# Patient Record
Sex: Female | Born: 1970 | Race: White | Hispanic: No | Smoking: Current some day smoker
Health system: Southern US, Community
[De-identification: ages and names within clinical notes are randomized; demographics above are authoritative.]

## PROBLEM LIST (undated history)

## (undated) DIAGNOSIS — J449 Chronic obstructive pulmonary disease, unspecified: Secondary | ICD-10-CM

## (undated) DIAGNOSIS — F419 Anxiety disorder, unspecified: Secondary | ICD-10-CM

## (undated) DIAGNOSIS — F32A Depression, unspecified: Secondary | ICD-10-CM

## (undated) DIAGNOSIS — M1990 Unspecified osteoarthritis, unspecified site: Secondary | ICD-10-CM

## (undated) DIAGNOSIS — G5790 Unspecified mononeuropathy of unspecified lower limb: Secondary | ICD-10-CM

## (undated) DIAGNOSIS — I10 Essential (primary) hypertension: Secondary | ICD-10-CM

## (undated) HISTORY — PX: INCISE AND DRAIN ABCESS: PRO64

## (undated) HISTORY — PX: CHOLECYSTECTOMY: SHX55

## (undated) HISTORY — PX: ABDOMINAL HYSTERECTOMY: SHX81

---

## 2001-11-01 ENCOUNTER — Emergency Department (HOSPITAL_COMMUNITY): Admission: EM | Admit: 2001-11-01 | Discharge: 2001-11-01 | Payer: Self-pay | Admitting: Emergency Medicine

## 2003-03-22 ENCOUNTER — Emergency Department (HOSPITAL_COMMUNITY): Admission: EM | Admit: 2003-03-22 | Discharge: 2003-03-22 | Payer: Self-pay | Admitting: Emergency Medicine

## 2011-04-06 ENCOUNTER — Encounter: Payer: Self-pay | Admitting: *Deleted

## 2011-04-06 ENCOUNTER — Emergency Department (HOSPITAL_COMMUNITY)
Admission: EM | Admit: 2011-04-06 | Discharge: 2011-04-06 | Discharge status: Against Medical Advice | Payer: Medicaid - Out of State | Attending: Emergency Medicine | Admitting: Emergency Medicine

## 2011-04-06 DIAGNOSIS — R51 Headache: Secondary | ICD-10-CM | POA: Insufficient documentation

## 2011-04-06 DIAGNOSIS — R0602 Shortness of breath: Secondary | ICD-10-CM | POA: Insufficient documentation

## 2011-04-06 DIAGNOSIS — J4489 Other specified chronic obstructive pulmonary disease: Secondary | ICD-10-CM | POA: Insufficient documentation

## 2011-04-06 DIAGNOSIS — J449 Chronic obstructive pulmonary disease, unspecified: Secondary | ICD-10-CM | POA: Insufficient documentation

## 2011-04-06 DIAGNOSIS — J3489 Other specified disorders of nose and nasal sinuses: Secondary | ICD-10-CM | POA: Insufficient documentation

## 2011-04-06 HISTORY — DX: Chronic obstructive pulmonary disease, unspecified: J44.9

## 2011-04-06 HISTORY — DX: Unspecified osteoarthritis, unspecified site: M19.90

## 2011-04-06 HISTORY — DX: Unspecified mononeuropathy of unspecified lower limb: G57.90

## 2011-04-06 HISTORY — DX: Essential (primary) hypertension: I10

## 2011-04-06 NOTE — ED Notes (Signed)
Pt c/o sob with headache and congestion. Pt states her copd is acting up.

## 2015-10-29 ENCOUNTER — Encounter (HOSPITAL_COMMUNITY): Payer: Self-pay | Admitting: Emergency Medicine

## 2015-10-29 ENCOUNTER — Emergency Department (HOSPITAL_COMMUNITY)
Admission: EM | Admit: 2015-10-29 | Discharge: 2015-10-29 | Disposition: A | Discharge status: Home / Self Care | Payer: Medicaid Other | Attending: Emergency Medicine | Admitting: Emergency Medicine

## 2015-10-29 ENCOUNTER — Emergency Department (HOSPITAL_COMMUNITY): Payer: Medicaid Other

## 2015-10-29 DIAGNOSIS — J45909 Unspecified asthma, uncomplicated: Secondary | ICD-10-CM | POA: Diagnosis not present

## 2015-10-29 DIAGNOSIS — Y999 Unspecified external cause status: Secondary | ICD-10-CM | POA: Diagnosis not present

## 2015-10-29 DIAGNOSIS — W1839XA Other fall on same level, initial encounter: Secondary | ICD-10-CM | POA: Insufficient documentation

## 2015-10-29 DIAGNOSIS — E119 Type 2 diabetes mellitus without complications: Secondary | ICD-10-CM | POA: Diagnosis not present

## 2015-10-29 DIAGNOSIS — I10 Essential (primary) hypertension: Secondary | ICD-10-CM | POA: Insufficient documentation

## 2015-10-29 DIAGNOSIS — J449 Chronic obstructive pulmonary disease, unspecified: Secondary | ICD-10-CM | POA: Insufficient documentation

## 2015-10-29 DIAGNOSIS — Z87891 Personal history of nicotine dependence: Secondary | ICD-10-CM | POA: Insufficient documentation

## 2015-10-29 DIAGNOSIS — M1990 Unspecified osteoarthritis, unspecified site: Secondary | ICD-10-CM | POA: Diagnosis not present

## 2015-10-29 DIAGNOSIS — S5001XA Contusion of right elbow, initial encounter: Secondary | ICD-10-CM | POA: Insufficient documentation

## 2015-10-29 DIAGNOSIS — M25521 Pain in right elbow: Secondary | ICD-10-CM | POA: Diagnosis present

## 2015-10-29 DIAGNOSIS — Z79899 Other long term (current) drug therapy: Secondary | ICD-10-CM | POA: Insufficient documentation

## 2015-10-29 DIAGNOSIS — Y92009 Unspecified place in unspecified non-institutional (private) residence as the place of occurrence of the external cause: Secondary | ICD-10-CM | POA: Insufficient documentation

## 2015-10-29 DIAGNOSIS — Y939 Activity, unspecified: Secondary | ICD-10-CM | POA: Insufficient documentation

## 2015-10-29 DIAGNOSIS — Z794 Long term (current) use of insulin: Secondary | ICD-10-CM | POA: Insufficient documentation

## 2015-10-29 DIAGNOSIS — Z791 Long term (current) use of non-steroidal anti-inflammatories (NSAID): Secondary | ICD-10-CM | POA: Diagnosis not present

## 2015-10-29 LAB — CBG MONITORING, ED: GLUCOSE-CAPILLARY: 302 mg/dL — AB (ref 65–99)

## 2015-10-29 MED ORDER — HYDROCODONE-ACETAMINOPHEN 5-325 MG PO TABS: ORAL_TABLET | ORAL | Status: DC

## 2015-10-29 NOTE — ED Notes (Signed)
Pt states, "my sugar is low, i'm feeling weak and tired" and requesting juice. CBG obtained, resulted as 302. PA notified.

## 2015-10-29 NOTE — Discharge Instructions (Signed)
Elbow Contusion ° An elbow contusion is a deep bruise of the elbow. Contusions happen when an injury causes bleeding under the skin. Signs of bruising include pain, puffiness (swelling), and discolored skin. The contusion may turn blue, purple, or yellow. °HOME CARE °· Put ice on the injured area. °¨ Put ice in a plastic bag. °¨ Place a towel between your skin and the bag. °¨ Leave the ice on for 15-20 minutes, 03-04 times a day. °· Only take medicines as told by your doctor. °· Rest your elbow until the pain and puffiness are better. °· Raise (elevate) your elbow to lessen puffiness. °· Put on an elastic wrap as told by your doctor. You can take it off for sleeping, showers, and baths. If your fingers get cold, blue, or lose feeling (numb), take the wrap off. Put the wrap back on more loosely. °· Use your elbow only as told by your doctor. If you are asked to do elbow exercises, do them as told. °· Keep all doctor visits as told. °GET HELP RIGHT AWAY IF: °· You have more redness, puffiness, or pain in your elbow. °· Your puffiness or pain is not helped by medicines. °· You have puffiness of the hand and fingers. °· You are not able to move your fingers or wrist. °· You start to lose feeling in your hand or fingers. °· Your fingers or hand become cold or blue. °MAKE SURE YOU:  °· Understand these instructions. °· Will watch your condition. °· Will get help right away if you are not doing well or get worse. °  °This information is not intended to replace advice given to you by your health care provider. Make sure you discuss any questions you have with your health care provider. °  °Document Released: 03/31/2011 Document Revised: 10/11/2011 Document Reviewed: 11/24/2014 °Elsevier Interactive Patient Education ©2016 [employer] ° °

## 2015-10-29 NOTE — ED Notes (Signed)
Pt states she fell last night in her home, pt was not using walker, fell on right elbow, complaining of pain in elbow

## 2015-10-30 NOTE — ED Provider Notes (Signed)
CSN: [id number]     Arrival date & time 10/29/15  1235 History   First MD Initiated Contact with Patient 10/29/15 1313     Chief Complaint  Patient presents with  . elbow pain      (Consider location/radiation/quality/duration/timing/severity/associated sxs/prior Treatment) HPI   Veronica Garcia is a 45 y.o. female who presents to the Emergency Department complaining of Sudden onset of pain to her right elbow after a fall. Incident occurred on the evening prior to arrival. She states that she typically uses a walker, but was not using her walker at the time which she believes caused her to fall. States she landed on her elbow. Pain is worse with movement. She denies shoulder pain, neck pain, head injury, numbness or weakness of the extremity. She has not taken any medication for her symptoms.   Past Medical History  Diagnosis Date  . COPD (chronic obstructive pulmonary disease) (HCC)   . Diabetes mellitus   . Hypertension   . Arthritis   . Asthma   . Neuropathy of foot    Past Surgical History  Procedure Laterality Date  . Abdominal hysterectomy     No family history on file. Social History  Substance Use Topics  . Smoking status: Former Smoker -- 1.50 packs/day    Quit date: 10/26/2015  . Smokeless tobacco: None  . Alcohol Use: No   OB History    No data available     Review of Systems  Constitutional: Negative for fever and chills.  Respiratory: Negative for shortness of breath.   Cardiovascular: Negative for chest pain.  Gastrointestinal: Negative for nausea and vomiting.  Musculoskeletal: Positive for joint swelling and arthralgias (Right elbow pain).  Skin: Negative for color change and wound.  Neurological: Negative for dizziness, weakness, numbness and headaches.  All other systems reviewed and are negative.     Allergies  Amoxicillin and Sulfa antibiotics  Home Medications   Prior to Admission medications   Medication Sig Start Date End Date Taking?  Authorizing Provider  busPIRone (BUSPAR) 5 MG tablet Take 10 mg by mouth 2 (two) times daily.  08/27/14  Yes Historical Provider, MD  Canagliflozin (INVOKANA PO) Take 1 tablet by mouth daily.   Yes Historical Provider, MD  Fluticasone-Salmeterol (ADVAIR DISKUS IN) Inhale 2 Inhalers into the lungs 2 (two) times daily.   Yes Historical Provider, MD  gabapentin (NEURONTIN) 800 MG tablet Take 800 mg by mouth 3 (three) times daily.   Yes Historical Provider, MD  ibuprofen (ADVIL,MOTRIN) 800 MG tablet Take 800 mg by mouth 3 (three) times daily.  01/07/14  Yes Historical Provider, MD  insulin aspart (NOVOLOG) 100 UNIT/ML injection Inject 10 Units into the skin daily.  11/05/13  Yes Historical Provider, MD  insulin detemir (LEVEMIR) 100 UNIT/ML injection Inject 40 Units into the skin 2 (two) times daily.  04/29/14  Yes Historical Provider, MD  lisinopril (PRINIVIL,ZESTRIL) 10 MG tablet Take 10 mg by mouth daily.  06/25/14  Yes Historical Provider, MD  PARoxetine (PAXIL) 40 MG tablet Take 40 mg by mouth daily.  06/25/14  Yes Historical Provider, MD  pravastatin (PRAVACHOL) 20 MG tablet Take by mouth. 04/17/14  Yes Historical Provider, MD  risperiDONE (RISPERDAL) 3 MG tablet Take 3 mg by mouth at bedtime.  03/28/14  Yes Historical Provider, MD  HYDROcodone-acetaminophen (NORCO/VICODIN) 5-325 MG tablet Take one tab po q 4-6 hrs prn pain 10/29/15   Yovanny Coats, PA-C   BP 149/99 mmHg  Pulse 85  Temp(Src) 97.5  F (36.4 C) (Oral)  Resp 16  Ht 5\' 2"  (1.575 m)  Wt 97.07 kg  BMI 39.13 kg/m2  SpO2 98% Physical Exam  Constitutional: She is oriented to person, place, and time. She appears well-developed and well-nourished. No distress.  HENT:  Head: Normocephalic and atraumatic.  Cardiovascular: Normal rate, regular rhythm and normal heart sounds.   Pulmonary/Chest: Effort normal and breath sounds normal.  Musculoskeletal: She exhibits tenderness. She exhibits no edema.  Tenderness on range of motion to the lateral  right elbow.  Radial pulse is brisk, distal sensation intact.  CR< 2 sec.  No edema, bruising, abrasion or bony deformity.   Compartments soft.  Neurological: She is alert and oriented to person, place, and time. She exhibits normal muscle tone. Coordination normal.  Skin: Skin is warm and dry.  Nursing note and vitals reviewed.   ED Course  Procedures (including critical care time) Labs Review Labs Reviewed  CBG MONITORING, ED - Abnormal; Notable for the following:    Glucose-Capillary 302 (*)    All other components within normal limits    Imaging Review Dg Elbow Complete Right  10/29/2015  CLINICAL DATA:  Fall last night, right elbow pain EXAM: RIGHT ELBOW - COMPLETE 3+ VIEW COMPARISON:  None. FINDINGS: Four views of the right elbow submitted. No acute fracture or subluxation. Mild spurring of humeral epicondyles. No posterior fat pad sign. IMPRESSION: No acute fracture or subluxation.  Mild degenerative changes. Electronically Signed   By: Natasha MeadLiviu  Pop M.D.   On: 10/29/2015 13:07   I have personally reviewed and evaluated these images and lab results as part of my medical decision-making.   EKG Interpretation None      MDM   Final diagnoses:  Elbow contusion, right, initial encounter   CBG elevated, but pt admits to eating meal prior to arrival.  No clinical sx's of DKA  Patient with likely contusion of the right elbow, x-ray negative for fracture.  Sling applied by nursing staff  Patient agrees to plan which includes ice, sling, and close orthopedic follow-up in one week if not improving.     Pauline Ausammy Baylie Drakes, PA-C 10/30/15 1542  Biagio Snelson, PA-C 10/30/15 1543  Bethann BerkshireJoseph Zammit, MD 11/01/15 1739

## 2015-11-17 ENCOUNTER — Encounter (HOSPITAL_COMMUNITY): Payer: Self-pay | Admitting: Emergency Medicine

## 2015-11-17 ENCOUNTER — Emergency Department (HOSPITAL_COMMUNITY): Payer: Medicaid Other

## 2015-11-17 ENCOUNTER — Emergency Department (HOSPITAL_COMMUNITY)
Admission: EM | Admit: 2015-11-17 | Discharge: 2015-11-17 | Disposition: A | Discharge status: Home / Self Care | Payer: Medicaid Other | Attending: Emergency Medicine | Admitting: Emergency Medicine

## 2015-11-17 DIAGNOSIS — Z794 Long term (current) use of insulin: Secondary | ICD-10-CM | POA: Insufficient documentation

## 2015-11-17 DIAGNOSIS — Z79899 Other long term (current) drug therapy: Secondary | ICD-10-CM | POA: Insufficient documentation

## 2015-11-17 DIAGNOSIS — R509 Fever, unspecified: Secondary | ICD-10-CM | POA: Diagnosis present

## 2015-11-17 DIAGNOSIS — J449 Chronic obstructive pulmonary disease, unspecified: Secondary | ICD-10-CM | POA: Insufficient documentation

## 2015-11-17 DIAGNOSIS — E119 Type 2 diabetes mellitus without complications: Secondary | ICD-10-CM | POA: Insufficient documentation

## 2015-11-17 DIAGNOSIS — Z791 Long term (current) use of non-steroidal anti-inflammatories (NSAID): Secondary | ICD-10-CM | POA: Insufficient documentation

## 2015-11-17 DIAGNOSIS — M25521 Pain in right elbow: Secondary | ICD-10-CM | POA: Diagnosis not present

## 2015-11-17 DIAGNOSIS — R0981 Nasal congestion: Secondary | ICD-10-CM | POA: Insufficient documentation

## 2015-11-17 DIAGNOSIS — Z87891 Personal history of nicotine dependence: Secondary | ICD-10-CM | POA: Diagnosis not present

## 2015-11-17 DIAGNOSIS — H9209 Otalgia, unspecified ear: Secondary | ICD-10-CM | POA: Insufficient documentation

## 2015-11-17 DIAGNOSIS — M1990 Unspecified osteoarthritis, unspecified site: Secondary | ICD-10-CM | POA: Insufficient documentation

## 2015-11-17 DIAGNOSIS — J069 Acute upper respiratory infection, unspecified: Secondary | ICD-10-CM | POA: Insufficient documentation

## 2015-11-17 DIAGNOSIS — I10 Essential (primary) hypertension: Secondary | ICD-10-CM | POA: Diagnosis not present

## 2015-11-17 LAB — CBC WITH DIFFERENTIAL/PLATELET
Basophils Absolute: 0 10*3/uL (ref 0.0–0.1)
Basophils Relative: 0 %
EOS PCT: 4 %
Eosinophils Absolute: 0.4 10*3/uL (ref 0.0–0.7)
HCT: 38.6 % (ref 36.0–46.0)
Hemoglobin: 13.2 g/dL (ref 12.0–15.0)
LYMPHS ABS: 2.9 10*3/uL (ref 0.7–4.0)
LYMPHS PCT: 31 %
MCH: 31.6 pg (ref 26.0–34.0)
MCHC: 34.2 g/dL (ref 30.0–36.0)
MCV: 92.3 fL (ref 78.0–100.0)
MONO ABS: 0.5 10*3/uL (ref 0.1–1.0)
MONOS PCT: 5 %
Neutro Abs: 5.8 10*3/uL (ref 1.7–7.7)
Neutrophils Relative %: 60 %
PLATELETS: 204 10*3/uL (ref 150–400)
RBC: 4.18 MIL/uL (ref 3.87–5.11)
RDW: 12.8 % (ref 11.5–15.5)
WBC: 9.6 10*3/uL (ref 4.0–10.5)

## 2015-11-17 LAB — COMPREHENSIVE METABOLIC PANEL
ALT: 20 U/L (ref 14–54)
AST: 16 U/L (ref 15–41)
Albumin: 3.7 g/dL (ref 3.5–5.0)
Alkaline Phosphatase: 94 U/L (ref 38–126)
Anion gap: 5 (ref 5–15)
BILIRUBIN TOTAL: 0.7 mg/dL (ref 0.3–1.2)
BUN: 8 mg/dL (ref 6–20)
CHLORIDE: 104 mmol/L (ref 101–111)
CO2: 26 mmol/L (ref 22–32)
CREATININE: 0.54 mg/dL (ref 0.44–1.00)
Calcium: 8.7 mg/dL — ABNORMAL LOW (ref 8.9–10.3)
Glucose, Bld: 217 mg/dL — ABNORMAL HIGH (ref 65–99)
POTASSIUM: 3.8 mmol/L (ref 3.5–5.1)
Sodium: 135 mmol/L (ref 135–145)
TOTAL PROTEIN: 7.1 g/dL (ref 6.5–8.1)

## 2015-11-17 LAB — URINALYSIS, ROUTINE W REFLEX MICROSCOPIC
BILIRUBIN URINE: NEGATIVE
Glucose, UA: 250 mg/dL — AB
Ketones, ur: NEGATIVE mg/dL
Leukocytes, UA: NEGATIVE
NITRITE: NEGATIVE
PROTEIN: NEGATIVE mg/dL
pH: 6 (ref 5.0–8.0)

## 2015-11-17 LAB — URINE MICROSCOPIC-ADD ON

## 2015-11-17 MED ORDER — ACETAMINOPHEN 500 MG PO TABS
1000.0000 mg | ORAL_TABLET | Freq: Once | ORAL | Status: AC
  Administered 2015-11-17: 1000 mg via ORAL
  Filled 2015-11-17: qty 2

## 2015-11-17 MED ORDER — IBUPROFEN 800 MG PO TABS
800.0000 mg | ORAL_TABLET | Freq: Once | ORAL | Status: AC
  Administered 2015-11-17: 800 mg via ORAL
  Filled 2015-11-17: qty 1

## 2015-11-17 NOTE — ED Triage Notes (Signed)
PT c/o generalized body aches and worsening pain to right upper extremity x3 days with thought fever at home. PT states last tylenol at 0900 this am.

## 2015-11-17 NOTE — Discharge Instructions (Signed)
Your examination and your lab tests suggest that you may have an upper respiratory related infection. Your lab tests and your x-rays are nonacute at this time. Please see your primary physician for assistance with pain management concerning your upper extremity until you can be seen by your specialist.

## 2015-11-17 NOTE — ED Notes (Signed)
Pt left room after PA told her he was going to leave pin medication up to her PCP and ortho specialist. Pt not in room when discharge papers were ready.

## 2015-11-17 NOTE — ED Provider Notes (Signed)
[facility]-EMERGENCY DEPT Provider Note   CSN: [id number] Arrival date & time: 11/17/15  1642  First Provider Contact:  None       History   Chief Complaint Chief Complaint  Patient presents with  . Fever    HPI Veronica Garcia is a 45 y.o. female.  Patient is a 45 year old female who presents to the emergency department with a complaint of fever.  The patient states that over the last 3 days she has been feeling "feverish". She complains of feeling weak and generally not feeling well on. She denies checking a temperature at home on. She denies unusual rash. There's been no open wounds that are not healing. She's had some sneezing. She's had some mild left ear pain. There's been no vomiting or diarrhea reported. The patient has some pain involving the right upper extremity, but no reported hot joints on. It is of note that the patient was evaluated for her extremity pain in the emergency department. X-rays were read as negative. There no neurovascular deficits appreciated. The patient was referred to orthopedics for additional evaluation and management of this problem. The patient presents now for evaluation of her fever, and not feeling well. She also requested her right upper extremity can be rechecked.      Past Medical History:  Diagnosis Date  . Arthritis   . Asthma   . COPD (chronic obstructive pulmonary disease) (HCC)   . Diabetes mellitus   . Hypertension   . Neuropathy of foot     There are no active problems to display for this patient.   Past Surgical History:  Procedure Laterality Date  . ABDOMINAL HYSTERECTOMY    . CHOLECYSTECTOMY    . INCISE AND DRAIN ABCESS      OB History    No data available       Home Medications    Prior to Admission medications   Medication Sig Start Date End Date Taking? Authorizing Provider  busPIRone (BUSPAR) 5 MG tablet Take 10 mg by mouth 2 (two) times daily.  08/27/14   Historical Provider, MD  Canagliflozin (INVOKANA PO)  Take 1 tablet by mouth daily.    Historical Provider, MD  Fluticasone-Salmeterol (ADVAIR DISKUS IN) Inhale 2 Inhalers into the lungs 2 (two) times daily.    Historical Provider, MD  gabapentin (NEURONTIN) 800 MG tablet Take 800 mg by mouth 3 (three) times daily.    Historical Provider, MD  HYDROcodone-acetaminophen (NORCO/VICODIN) 5-325 MG tablet Take one tab po q 4-6 hrs prn pain 10/29/15   Tammy Triplett, PA-C  ibuprofen (ADVIL,MOTRIN) 800 MG tablet Take 800 mg by mouth 3 (three) times daily.  01/07/14   Historical Provider, MD  insulin aspart (NOVOLOG) 100 UNIT/ML injection Inject 10 Units into the skin daily.  11/05/13   Historical Provider, MD  insulin detemir (LEVEMIR) 100 UNIT/ML injection Inject 40 Units into the skin 2 (two) times daily.  04/29/14   Historical Provider, MD  lisinopril (PRINIVIL,ZESTRIL) 10 MG tablet Take 10 mg by mouth daily.  06/25/14   Historical Provider, MD  PARoxetine (PAXIL) 40 MG tablet Take 40 mg by mouth daily.  06/25/14   Historical Provider, MD  pravastatin (PRAVACHOL) 20 MG tablet Take by mouth. 04/17/14   Historical Provider, MD  risperiDONE (RISPERDAL) 3 MG tablet Take 3 mg by mouth at bedtime.  03/28/14   Historical Provider, MD    Family History History reviewed. No pertinent family history.  Social History Social History  Substance Use Topics  .  Smoking status: Former Smoker    Packs/day: 1.50    Quit date: 10/26/2015  . Smokeless tobacco: Never Used  . Alcohol use No     Allergies   Amoxicillin and Sulfa antibiotics   Review of Systems Review of Systems  Constitutional: Positive for activity change, chills and fatigue.  HENT: Positive for congestion, ear pain, postnasal drip and sneezing.   Musculoskeletal: Positive for arthralgias.  All other systems reviewed and are negative.    Physical Exam Updated Vital Signs BP 134/73 (BP Location: Left Arm)   Pulse 84   Temp 98.7 F (37.1 C) (Oral)   Resp 20   Ht 5\' 2"  (1.575 m)   Wt 97.1 kg    SpO2 100%   BMI 39.14 kg/m   Physical Exam  Constitutional: She appears well-developed and well-nourished. No distress.  HENT:  Head: Normocephalic and atraumatic.  Right Ear: External ear normal.  Left Ear: External ear normal.  Mild to moderate nasal congestion.  Eyes: Conjunctivae are normal. Right eye exhibits no discharge. Left eye exhibits no discharge. No scleral icterus.  Neck: Neck supple. No tracheal deviation present.  Cardiovascular: Normal rate, regular rhythm and intact distal pulses.   Pulmonary/Chest: Effort normal and breath sounds normal. No stridor. No respiratory distress. She has no wheezes. She has no rales.  Abdominal: Soft. Bowel sounds are normal. She exhibits no distension. There is no tenderness. There is no rebound and no guarding.  Musculoskeletal: She exhibits tenderness. She exhibits no edema.  There is good range of motion of upper and lower extremities. There is mild increased warmth of the right elbow area, and there is pain with flexion and extension of the elbow, otherwise no significant changes. The radial pulses are 2+ bilaterally. The dorsalis pedis pulses are 2+ bilaterally. Capillary refill in the upper and lower extremities is less than 2 seconds.  Neurological: She is alert. She has normal strength. No cranial nerve deficit (no facial droop, extraocular movements intact, no slurred speech) or sensory deficit. She exhibits normal muscle tone. She displays no seizure activity. Coordination normal.  Skin: Skin is warm and dry. No rash noted.  Psychiatric: She has a normal mood and affect.  Nursing note and vitals reviewed.    ED Treatments / Results  Labs (all labs ordered are listed, but only abnormal results are displayed) Labs Reviewed  COMPREHENSIVE METABOLIC PANEL - Abnormal; Notable for the following:       Result Value   Glucose, Bld 217 (*)    Calcium 8.7 (*)    All other components within normal limits  CBC WITH DIFFERENTIAL/PLATELET    URINALYSIS, ROUTINE W REFLEX MICROSCOPIC (NOT AT [facility])    EKG  EKG Interpretation None       Radiology Dg Chest 2 View  Result Date: 11/17/2015 CLINICAL DATA:  Fall 3 weeks ago with persistent arm pain and weakness with fevers, initial encounter EXAM: CHEST  2 VIEW COMPARISON:  09/27/2015 FINDINGS: Cardiac shadow is mildly enlarged but stable. The lungs are well aerated bilaterally. No focal infiltrate or sizable effusion is seen. No acute bony abnormality is noted. 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 IMPRESSION: No active cardiopulmonary disease. Electronically Signed   By: Alcide Clever M.D.   On: 11/17/2015 19:34   Procedures Procedures (including critical care time)  Medications Ordered in ED Medications  ibuprofen (ADVIL,MOTRIN) tablet 800 mg (800 mg Oral Given 11/17/15 1925)  acetaminophen (TYLENOL) tablet 1,000 mg (1,000 mg Oral Given 11/17/15  1925)     Initial Impression / Assessment and Plan / ED Course  I have reviewed the triage vital signs and the nursing notes.  Pertinent labs & imaging results that were available during my care of the patient were reviewed by me and considered in my medical decision making (see chart for details).  Clinical Course    **I have reviewed nursing notes, vital signs, and all appropriate lab and imaging results for this patient.*  Final Clinical Impressions(s) / ED Diagnoses  Patient presents to the emergency department with a subjective fever. She reports chills, fatigue, and generally not feeling well. She also complains of problems with her right upper extremity. Previous emergency department evaluation was negative for acute problems or issues involving the right upper extremity.  The patient was treated with 800 mg of ibuprofen and 1000 mg of Tylenol to assist with her upper extremity pain.  Complete blood count is well within normal limits. Competence of metabolic panel shows a glucose to be elevated at  217, otherwise within normal limits. The chest x-ray is negative for acute problem or event.  No acute problems found on examination. Questions answered. Patient is referred to her primary physician for additional evaluation and management.    Final diagnoses:  None    New Prescriptions New Prescriptions   No medications on file     Ivery Quale, PA-C 11/18/15 2310    Zadie Rhine, MD 11/19/15 1421

## 2015-11-17 NOTE — ED Notes (Signed)
Pt states she fell 2 weeks ago. Has appt w/ ortho in August. Says the pain has gotten worse. Not feeling good.

## 2018-05-22 IMAGING — DX DG CHEST 2V
2 series · 2 of 2 positions shown · non-contrast
Comparison: 09/27/2015

CLINICAL DATA: Fall 3 weeks ago with persistent arm pain and
weakness with fevers, initial encounter

EXAM:
CHEST  2 VIEW

[chest pa]
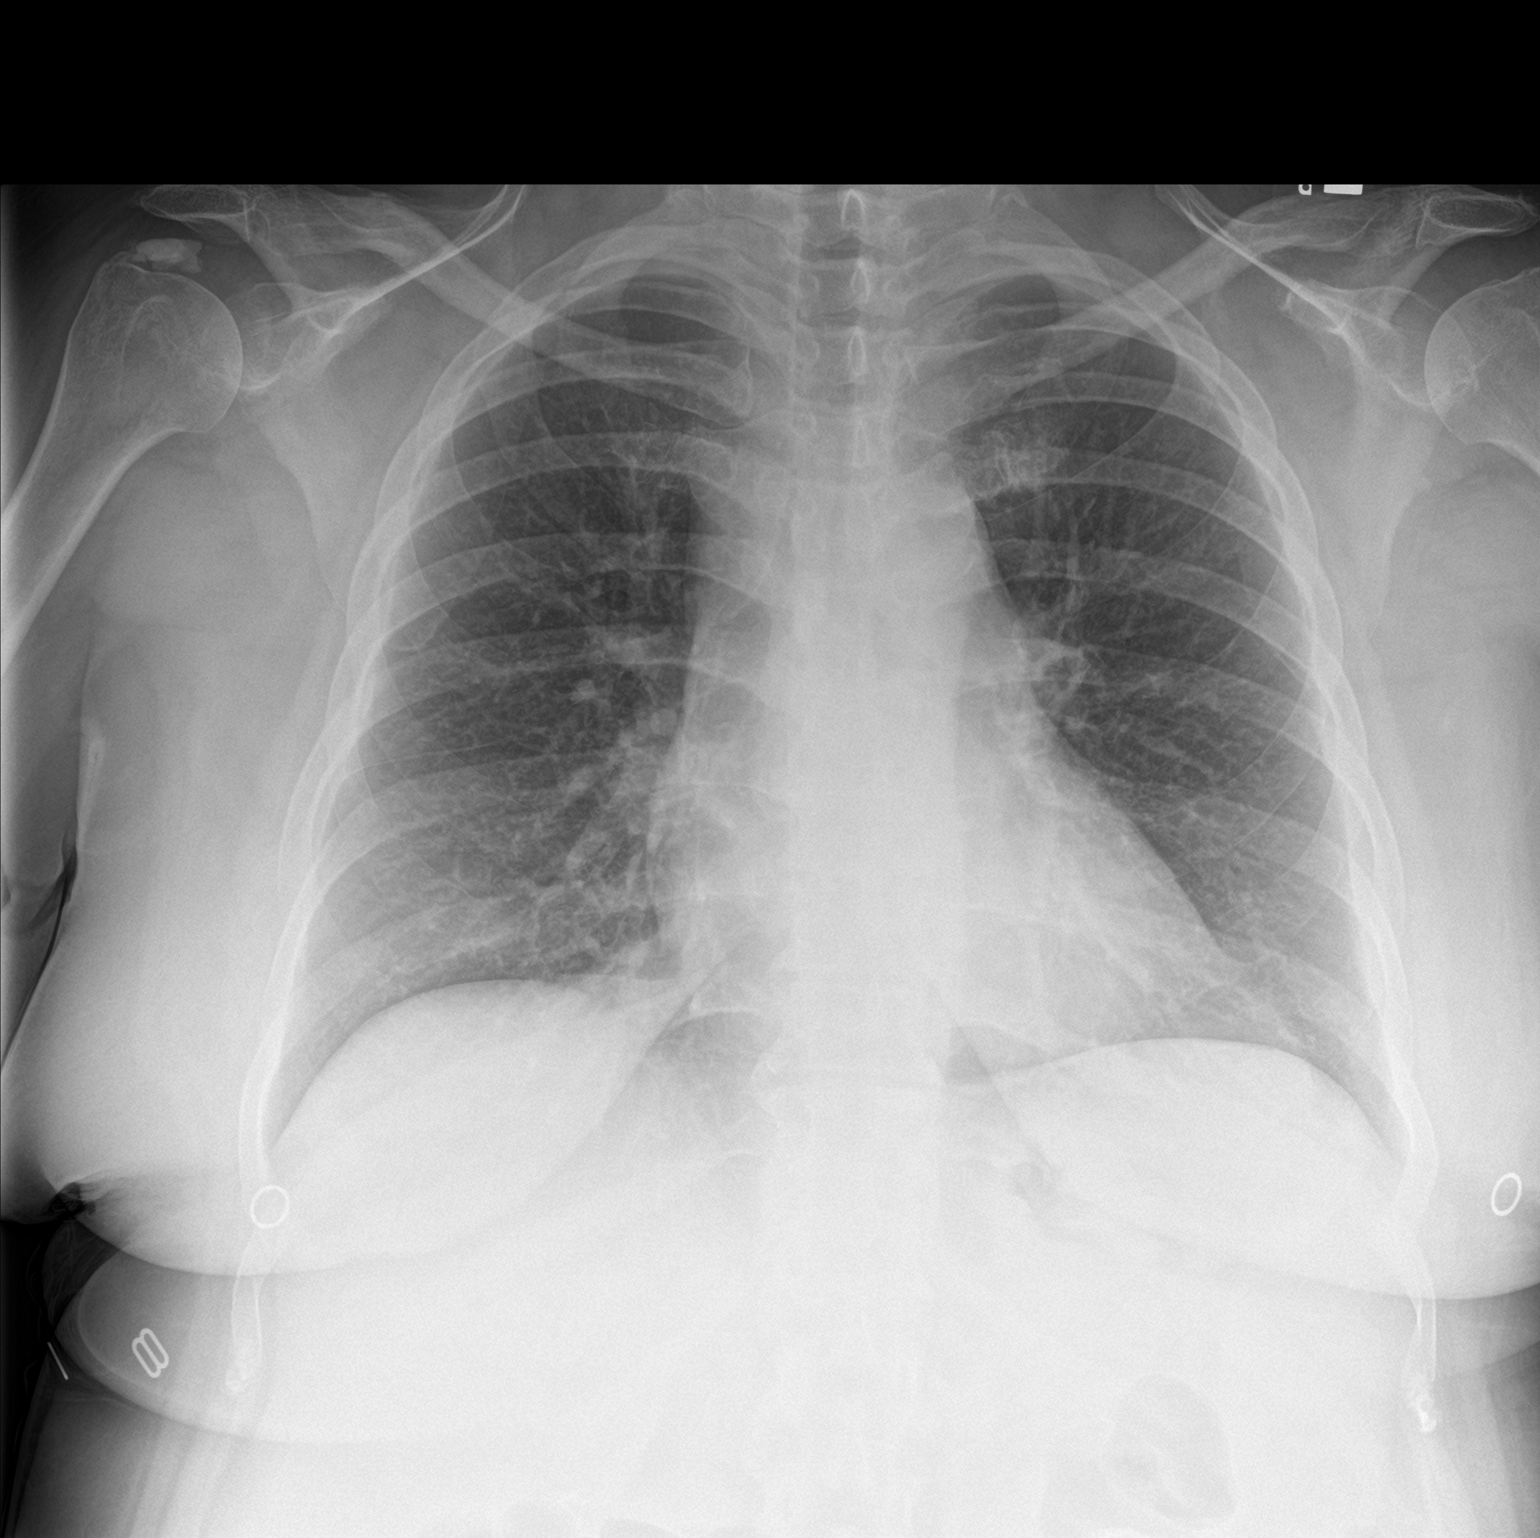

[chest lat]
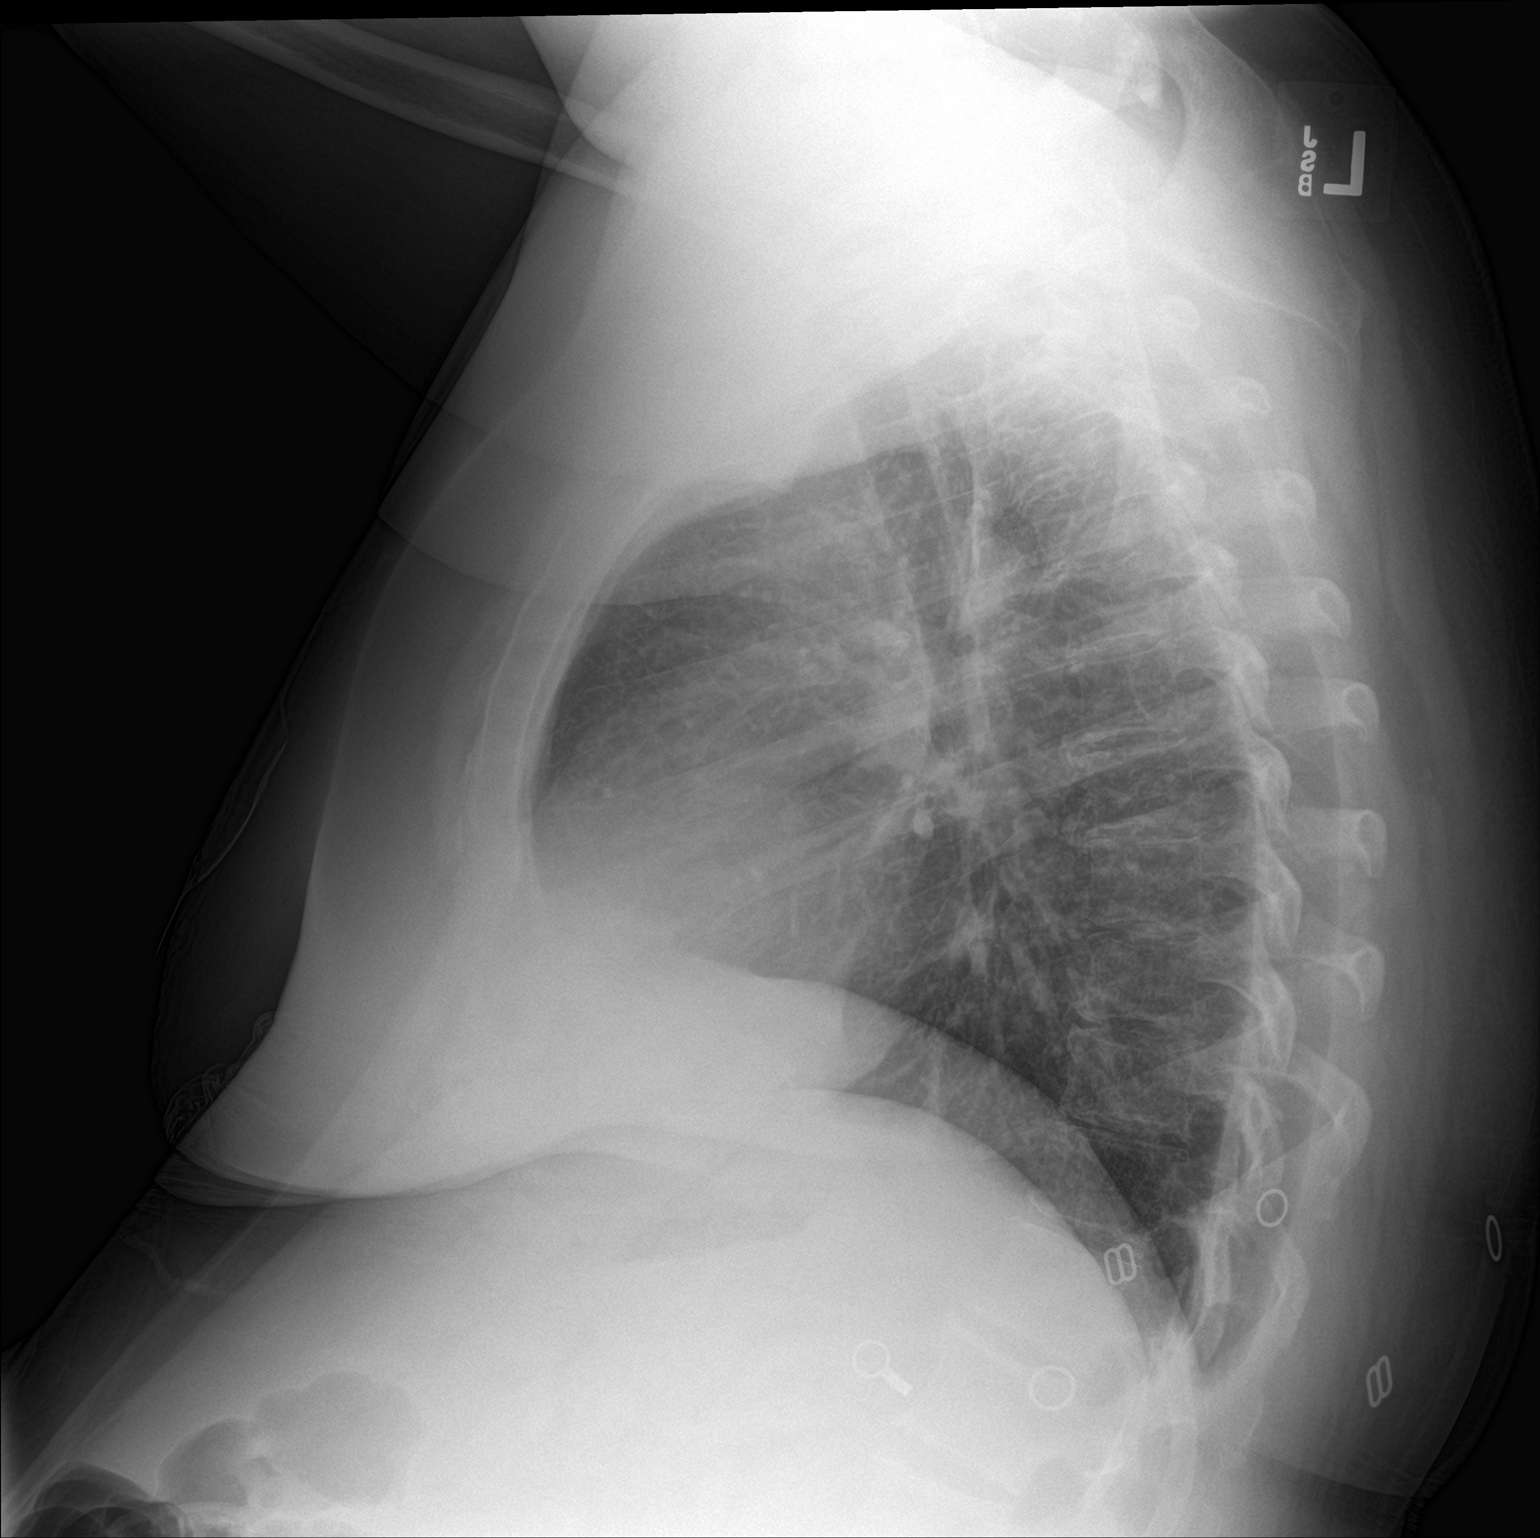

[2 of 2 positions shown; findings below may reference images not displayed]

FINDINGS: Cardiac shadow is mildly enlarged but stable. The lungs are well
aerated bilaterally. No focal infiltrate or sizable effusion is
seen. No acute bony abnormality is noted. Chronic calcific
tendonitis is noted in the right shoulder.
IMPRESSION: No active cardiopulmonary disease.

## 2018-10-18 ENCOUNTER — Telehealth: Payer: Self-pay

## 2018-10-18 NOTE — Telephone Encounter (Signed)
Error

## 2022-09-22 ENCOUNTER — Other Ambulatory Visit: Payer: Self-pay

## 2022-09-22 ENCOUNTER — Encounter (HOSPITAL_COMMUNITY): Payer: Self-pay | Admitting: *Deleted

## 2022-09-22 ENCOUNTER — Emergency Department (HOSPITAL_COMMUNITY)
Admission: EM | Admit: 2022-09-22 | Discharge: 2022-09-22 | Disposition: A | Discharge status: Home / Self Care | Payer: Medicaid Other | Attending: Emergency Medicine | Admitting: Emergency Medicine

## 2022-09-22 DIAGNOSIS — J449 Chronic obstructive pulmonary disease, unspecified: Secondary | ICD-10-CM | POA: Insufficient documentation

## 2022-09-22 DIAGNOSIS — R21 Rash and other nonspecific skin eruption: Secondary | ICD-10-CM | POA: Insufficient documentation

## 2022-09-22 DIAGNOSIS — W57XXXA Bitten or stung by nonvenomous insect and other nonvenomous arthropods, initial encounter: Secondary | ICD-10-CM | POA: Insufficient documentation

## 2022-09-22 DIAGNOSIS — R197 Diarrhea, unspecified: Secondary | ICD-10-CM | POA: Diagnosis not present

## 2022-09-22 DIAGNOSIS — Z79899 Other long term (current) drug therapy: Secondary | ICD-10-CM | POA: Insufficient documentation

## 2022-09-22 DIAGNOSIS — J45909 Unspecified asthma, uncomplicated: Secondary | ICD-10-CM | POA: Insufficient documentation

## 2022-09-22 DIAGNOSIS — Z794 Long term (current) use of insulin: Secondary | ICD-10-CM | POA: Insufficient documentation

## 2022-09-22 DIAGNOSIS — R509 Fever, unspecified: Secondary | ICD-10-CM | POA: Diagnosis not present

## 2022-09-22 DIAGNOSIS — I10 Essential (primary) hypertension: Secondary | ICD-10-CM | POA: Diagnosis not present

## 2022-09-22 MED ORDER — IBUPROFEN 800 MG PO TABS
800.0000 mg | ORAL_TABLET | Freq: Once | ORAL | Status: AC
  Administered 2022-09-22: 800 mg via ORAL
  Filled 2022-09-22: qty 1

## 2022-09-22 MED ORDER — IBUPROFEN 800 MG PO TABS: 800.0000 mg | ORAL_TABLET | Freq: Three times a day (TID) | ORAL | 0 refills | Status: DC

## 2022-09-22 NOTE — ED Provider Notes (Signed)
[place] EMERGENCY DEPARTMENT AT [facility] Provider Note   CSN: [id number] Arrival date & time: 09/22/22  1529     History  Chief Complaint  Patient presents with   Insect Bite    Veronica Garcia is a 52 y.o. female.  With a history of neuropathy, arthritis, hypertension, COPD, asthma, diabetes who presents to the ED for evaluation of bug bites.  She states her house was infested with fleas approximately 3 weeks ago.  She suffered bites to bilateral lower extremities.  States a similar presentation happened approximately 1 year ago.  She presented to her Suboxone provider last week who started her on doxycycline 100 mg twice daily for 10 days.  She is on day 7 today.  States that she continues to pick the scabs from the bites and noticed a fever last night.  She took Tylenol at that time which improved her symptoms.  Last use was yesterday evening.  She reports diarrhea but denies nausea, vomiting or abdominal pain.  Currently denies feeling febrile.  She reports swelling to bilateral ankles as well which began yesterday. She denies joint pain outside of her typical arthritis pain. She smokes cigarettes.  Denies alcohol use or recreational drug use.  She did use calamine lotion with some improvement in her symptoms but has not used any recently. Her house has since been exterminated. She reports the fleas began with her dogs.  HPI     Home Medications Prior to Admission medications   Medication Sig Start Date End Date Taking? Authorizing Provider  ibuprofen (ADVIL) 800 MG tablet Take 1 tablet (800 mg total) by mouth 3 (three) times daily. 09/22/22  Yes Benedict Kue, Edsel Petrin, PA-C  busPIRone (BUSPAR) 5 MG tablet Take 10 mg by mouth 2 (two) times daily.  08/27/14   [provider]  Canagliflozin (INVOKANA PO) Take 1 tablet by mouth daily.    [provider]  Fluticasone-Salmeterol (ADVAIR DISKUS IN) Inhale 2 Inhalers into the lungs 2 (two) times daily.    [provider]  gabapentin (NEURONTIN) 800 MG tablet Take 800 mg by mouth 3 (three) times daily.    [provider]  HYDROcodone-acetaminophen (NORCO/VICODIN) 5-325 MG tablet Take one tab po q 4-6 hrs prn pain 10/29/15   Triplett, Tammy, PA-C  ibuprofen (ADVIL,MOTRIN) 800 MG tablet Take 800 mg by mouth 3 (three) times daily.  01/07/14   [provider]  insulin aspart (NOVOLOG) 100 UNIT/ML injection Inject 10 Units into the skin daily.  11/05/13   [provider]  insulin detemir (LEVEMIR) 100 UNIT/ML injection Inject 40 Units into the skin 2 (two) times daily.  04/29/14   [provider]  lisinopril (PRINIVIL,ZESTRIL) 10 MG tablet Take 10 mg by mouth daily.  06/25/14   [provider]  PARoxetine (PAXIL) 40 MG tablet Take 40 mg by mouth daily.  06/25/14   [provider]  pravastatin (PRAVACHOL) 20 MG tablet Take by mouth. 04/17/14   [provider]  risperiDONE (RISPERDAL) 3 MG tablet Take 3 mg by mouth at bedtime.  03/28/14   [provider]      Allergies    Amoxicillin and Sulfa antibiotics    Review of Systems   Review of Systems  Skin:  Positive for wound.  All other systems reviewed and are negative.   Physical Exam Updated Vital Signs BP 111/70   Pulse 70   Temp 98.5 F (36.9 C) (Oral)   Resp 16   Ht  5\' 2"  (1.575 m)   Wt 95.7 kg   SpO2 98%   BMI 38.59 kg/m  Physical Exam Vitals and nursing note reviewed.  Constitutional:      General: She is not in acute distress.    Appearance: Normal appearance. She is normal weight. She is not ill-appearing.  HENT:     Head: Normocephalic and atraumatic.  Pulmonary:     Effort: Pulmonary effort is normal. No respiratory distress.  Abdominal:     General: Abdomen is flat.  Musculoskeletal:        General: Normal range of motion.     Cervical back: Neck supple.     Comments: Minimal swelling to bilateral ankles.  No pitting edema.  No calf tenderness. No erythema   Skin:    General: Skin is warm and dry.     Comments: Multiple scabs to the anterior portions of bilateral lower extremities.  No lesions to the posterior lower legs.  2 small scabs to the right upper extremity as well.  Lesions are with excoriations as well.  No surrounding erythema of any of the lesions.  Neurological:     Mental Status: She is alert and oriented to person, place, and time.  Psychiatric:        Mood and Affect: Mood normal.        Behavior: Behavior normal.     ED Results / Procedures / Treatments   Labs (all labs ordered are listed, but only abnormal results are displayed) Labs Reviewed - No data to display  EKG None  Radiology No results found.  Procedures Procedures    Medications Ordered in ED Medications  ibuprofen (ADVIL) tablet 800 mg (800 mg Oral Given 09/22/22 1719)    ED Course/ Medical Decision Making/ A&P                             Medical Decision Making This patient presents to the ED for concern of bilateral lower extremity scabs, this involves an extensive number of treatment options.  The differential diagnosis includes flea or other bug bites, cellulitis, excoriations  Co morbidities that complicate the patient evaluation  neuropathy, arthritis, hypertension, COPD, asthma, diabetes  Additional history obtained from: Nursing notes from this visit. Family husband is at bed side and provides a portion of the history  Afebrile, hemodynamically stable.  52 year old female presenting to the ED for evaluation lesions to bilateral lower extremities.  On exam, she has multiple old appearing lesions that appear to be deroofed secondary to excoriations.  She also has scarring to bilateral lower extremities.  She reports a similar presentation happened approximately 1 year ago.  Her symptoms may have began as fleabites include bedbugs bites, however they are in an odd pattern given that the lesions are only on the anterior portions of the legs  and do not extend posteriorly. There are no new appearing lesions present. No lesions of the hands or feet. The lesions do not appear to be infected. She is already on antibiotics. She states she already has problems with scratching her skin for which she seeks specialized treatment. She did use calamine lotion with relief of her symptoms. She declines hydroxyzine prescription for itching and states that she would prefer to use benadryl at home. Overall I suspect her lesions are secondary to her psychiatric condition and continued itching and deroofing. I have low suspicion for cellulitis. Also have low suspicion for DVT given her bilateral,  mild and equal swelling. I had a shared decision making conversation with patient regarding ultrasound to rule out DVT, patient declined. She was encouraged to use calamine lotion, continue her antibiotics, and use benadryl to prevent itching. She was encouraged to follow up with her PCP in one week for reevaluation. She was given return precautions. Stable at discharge.  At this time there does not appear to be any evidence of an acute emergency medical condition and the patient appears stable for discharge with appropriate outpatient follow up. Diagnosis was discussed with patient who verbalizes understanding of care plan and is agreeable to discharge. I have discussed return precautions with patient and husband who verbalizes understanding. Patient encouraged to follow-up with their PCP within 1 week. All questions answered.  Note: Portions of this report may have been transcribed using voice recognition software. Every effort was made to ensure accuracy; however, inadvertent computerized transcription errors may still be present.        Final Clinical Impression(s) / ED Diagnoses Final diagnoses:  Rash    Rx / DC Orders ED Discharge Orders          Ordered    ibuprofen (ADVIL) 800 MG tablet  3 times daily        09/22/22 1715               Mora Bellman 09/22/22 1731    Jacalyn Lefevre, MD 09/22/22 Rickey Primus

## 2022-09-22 NOTE — ED Notes (Signed)
Pt given a sandwich bag per PA approval.

## 2022-09-22 NOTE — Discharge Instructions (Addendum)
You have been seen today for your complaint of insect bites. Your discharge medications include calamine lotion. Apply to the bites to prevent itching Benadryl. Take one tablet every 6 hours as needed for itching. Home care instructions are as follows:  Avoid itching the areas Follow up with: your primary care doctor as soon as possible Please seek immediate medical care if you develop any of the following symptoms: You have joint pain. You have a rash. You feel weak or more tired than you normally do. You have neck pain or a headache. You have signs of an anaphylactic reaction. Signs may include: Swelling of your eyes, lips, face, mouth, tongue, or throat. Feeling warm in the face. Itchy, red, swollen areas of skin. Trouble with breathing, talking, or swallowing. Wheezing. Feeling dizzy or light-headed. Fainting. Pain or cramps in your belly. Vomiting or watery poop. At this time there does not appear to be the presence of an emergent medical condition, however there is always the potential for conditions to change. Please read and follow the below instructions.  Do not take your medicine if  develop an itchy rash, swelling in your mouth or lips, or difficulty breathing; call 911 and seek immediate emergency medical attention if this occurs.  You may review your lab tests and imaging results in their entirety on your MyChart account.  Please discuss all results of fully with your primary care provider and other specialist at your follow-up visit.  Note: Portions of this text may have been transcribed using voice recognition software. Every effort was made to ensure accuracy; however, inadvertent computerized transcription errors may still be present.

## 2022-09-22 NOTE — ED Triage Notes (Signed)
Pt states she has flea bites to bilateral legs.  Swelling noted to LE's as well. Pt seen her PCP and is currently on an antibiotic.

## 2022-09-22 NOTE — ED Notes (Signed)
Pt and family given drink at this time.

## 2023-12-14 ENCOUNTER — Encounter (INDEPENDENT_AMBULATORY_CARE_PROVIDER_SITE_OTHER): Payer: Self-pay | Admitting: *Deleted

## 2024-01-17 ENCOUNTER — Encounter (INDEPENDENT_AMBULATORY_CARE_PROVIDER_SITE_OTHER): Payer: Self-pay | Admitting: *Deleted

## 2024-01-29 ENCOUNTER — Ambulatory Visit (INDEPENDENT_AMBULATORY_CARE_PROVIDER_SITE_OTHER): Admitting: Gastroenterology

## 2024-02-15 ENCOUNTER — Ambulatory Visit (INDEPENDENT_AMBULATORY_CARE_PROVIDER_SITE_OTHER): Admitting: Gastroenterology

## 2024-02-15 ENCOUNTER — Encounter (INDEPENDENT_AMBULATORY_CARE_PROVIDER_SITE_OTHER): Payer: Self-pay | Admitting: Gastroenterology

## 2024-02-15 VITALS — BP 130/82 | HR 90 | Temp 97.5°F | Ht 62.0 in | Wt 190.5 lb

## 2024-02-15 DIAGNOSIS — Z1211 Encounter for screening for malignant neoplasm of colon: Secondary | ICD-10-CM | POA: Insufficient documentation

## 2024-02-15 DIAGNOSIS — R1310 Dysphagia, unspecified: Secondary | ICD-10-CM | POA: Diagnosis not present

## 2024-02-15 DIAGNOSIS — R1319 Other dysphagia: Secondary | ICD-10-CM

## 2024-02-15 NOTE — Progress Notes (Unsigned)
 Veronica Garcia, M.D. Gastroenterology & Hepatology [facility] Gastroenterology [address] [place], [place] [address] Primary Care Physician: Veronica Garcia LABOR, MD [address] [place] [address]  Referring MD: PCP  Chief Complaint:  dysphagia  History of Present Illness: Veronica Garcia is a 53 y.o. female with past medical history of COPD, diabetes, hypertension, neuropathy, asthma, who presents for evaluation of dysphagia.  Patient reports that over a month and a half she noticed progressive issues of dysphagia to solids and also pills. She has to crush her pills. She feels food gets stuck in the throat but it eventually goes down. No issues with swallowing liquids. She reports that she is taking pudding consistency food as she is afraid she may have issues with dysphagia if she advances her diet. No heartburn or odynophagia, but has been burping. Not on any antacids.  She reports having some nausea and dizzy, for which she takes meclizine.   Patient had issues with Suboxone related constipation, foir which she takes Clearlax. Stopped this as she went down to pudding consistency food. The patient denies having any nausea, vomiting, fever, chills, hematochezia, melena, hematemesis, abdominal distention, abdominal pain, diarrhea, jaundice, pruritus. Has lost 27 lb in the last 2 months.  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  Last ZHI:wzczm Last Colonoscopy:never  FHx: neg for any gastrointestinal/liver disease, mother uterine cancer, aunts had colon polyps Social: quit smoking 2 months ago, neg alcohol or illicit drug use Surgical: hysterectomy, tubal ligation  Past Medical History: Past Medical History:  Diagnosis Date   Arthritis    Asthma    COPD (chronic obstructive pulmonary disease) (HCC)    Diabetes mellitus    Hypertension    Neuropathy of foot     Past Surgical History: Past  Surgical History:  Procedure Laterality Date   ABDOMINAL HYSTERECTOMY     CHOLECYSTECTOMY     INCISE AND DRAIN ABCESS      Family History:History reviewed. No pertinent family history.  Social History: Social History   Tobacco Use  Smoking Status Some Days   Current packs/day: 0.00   Types: Cigarettes   Last attempt to quit: 10/26/2015   Years since quitting: 8.3  Smokeless Tobacco Never   Social History   Substance and Sexual Activity  Alcohol Use No   Social History   Substance and Sexual Activity  Drug Use No    Allergies: Allergies  Allergen Reactions   Amoxicillin Hives   Sulfa Antibiotics Hives    Medications: Current Outpatient Medications  Medication Sig Dispense Refill   amLODipine (NORVASC) 2.5 MG tablet Take 2.5 mg by mouth daily.     insulin glargine (LANTUS) 100 UNIT/ML injection Inject 10 Units into the skin daily as needed.     losartan (COZAAR) 100 MG tablet Take 100 mg by mouth daily.     meclizine (ANTIVERT) 25 MG tablet Take 25 mg by mouth daily at 6 (six) AM.     SUBOXONE 8-2 MG FILM Place 0.5 each under the tongue 4 (four) times daily.     No current facility-administered medications for this visit.    Review of Systems: GENERAL: negative for malaise, night sweats HEENT: No changes in hearing or vision, no nose bleeds or other nasal problems. NECK: Negative for lumps, goiter, pain and significant neck swelling RESPIRATORY: Negative for cough, wheezing CARDIOVASCULAR: Negative for chest pain, leg swelling, palpitations, orthopnea GI: SEE HPI  MUSCULOSKELETAL: Negative for joint pain or swelling, back pain, and muscle pain. SKIN: Negative for lesions, rash PSYCH: Negative for sleep disturbance, mood disorder and recent psychosocial stressors. HEMATOLOGY Negative for prolonged bleeding, bruising easily, and swollen nodes. ENDOCRINE: Negative for cold or heat intolerance, polyuria, polydipsia and goiter. NEURO: negative for tremor, gait  imbalance, syncope and seizures. The remainder of the review of systems is noncontributory.   Physical Exam: BP 130/82 (BP Location: Left Arm, Patient Position: Sitting, Cuff Size: Normal)   Pulse 90   Temp (!) 97.5 F (36.4 C) (Temporal)   Ht 5' 2 (1.575 m)   Wt 190 lb 8 oz (86.4 kg)   BMI 34.84 kg/m  GENERAL: The patient is AO x3, in no acute distress. HEENT: Head is normocephalic and atraumatic. EOMI are intact. Mouth is well hydrated and without lesions. NECK: Supple. No masses LUNGS: Clear to auscultation. No presence of rhonchi/wheezing/rales. Adequate chest expansion HEART: RRR, normal s1 and s2. ABDOMEN: Soft, nontender, no guarding, no peritoneal signs, and nondistended. BS +. No masses. RECTAL EXAM: no external lesions, normal tone, no masses, brown stool without blood.*** Chaperone: EXTREMITIES: Without any cyanosis, clubbing, rash, lesions or edema. NEUROLOGIC: AOx3, no focal motor deficit. SKIN: no jaundice, no rashes   Imaging/Labs: as above  I personally reviewed and interpreted the available labs, imaging and endoscopic files.  Impression and Plan: Veronica Garcia is a 53 y.o. female with ***   All questions were answered.      Veronica Fortune, MD Gastroenterology and Hepatology [facility] Gastroenterology

## 2024-02-15 NOTE — Patient Instructions (Addendum)
 Schedule EGD and colonoscopy Cut food in small pieces and chew food thoroughly to avoid choking episodes.

## 2024-02-16 ENCOUNTER — Telehealth (INDEPENDENT_AMBULATORY_CARE_PROVIDER_SITE_OTHER): Payer: Self-pay

## 2024-02-16 MED ORDER — PEG 3350-KCL-NA BICARB-NACL 420 G PO SOLR
4000.0000 mL | Freq: Once | ORAL | 0 refills | Status: AC
Start: 2024-02-16 — End: 2024-02-16

## 2024-02-16 NOTE — Telephone Encounter (Signed)
 Spoke with patient, scheduled TCS/EGD for 03/18/2024 at 9:00am. Rx sent to pharmacy. Instructions sent to address patient gave me [phone] in [place] [address]).

## 2024-02-22 NOTE — Telephone Encounter (Signed)
 Prior shara is not required for TCS/EGD

## 2024-02-29 ENCOUNTER — Encounter (INDEPENDENT_AMBULATORY_CARE_PROVIDER_SITE_OTHER): Payer: Self-pay | Admitting: Gastroenterology

## 2024-03-04 NOTE — Telephone Encounter (Signed)
 LMVOM to return call  Pt left vm stating she had questions about upcoming procedure.

## 2024-03-18 ENCOUNTER — Ambulatory Visit (HOSPITAL_COMMUNITY): Admitting: Anesthesiology

## 2024-03-18 ENCOUNTER — Encounter (HOSPITAL_COMMUNITY): Payer: Self-pay | Admitting: Gastroenterology

## 2024-03-18 ENCOUNTER — Other Ambulatory Visit: Payer: Self-pay

## 2024-03-18 ENCOUNTER — Encounter (HOSPITAL_COMMUNITY)
Admission: RE | Disposition: A | Discharge status: Home / Self Care | Payer: Self-pay | Source: Home / Self Care | Attending: Gastroenterology

## 2024-03-18 ENCOUNTER — Ambulatory Visit (HOSPITAL_COMMUNITY)
Admission: RE | Admit: 2024-03-18 | Discharge: 2024-03-18 | Disposition: A | Discharge status: Home / Self Care | Attending: Gastroenterology | Admitting: Gastroenterology

## 2024-03-18 DIAGNOSIS — R1310 Dysphagia, unspecified: Secondary | ICD-10-CM

## 2024-03-18 DIAGNOSIS — K649 Unspecified hemorrhoids: Secondary | ICD-10-CM

## 2024-03-18 DIAGNOSIS — J449 Chronic obstructive pulmonary disease, unspecified: Secondary | ICD-10-CM

## 2024-03-18 DIAGNOSIS — Z794 Long term (current) use of insulin: Secondary | ICD-10-CM | POA: Diagnosis not present

## 2024-03-18 DIAGNOSIS — Z79899 Other long term (current) drug therapy: Secondary | ICD-10-CM | POA: Insufficient documentation

## 2024-03-18 DIAGNOSIS — F419 Anxiety disorder, unspecified: Secondary | ICD-10-CM | POA: Diagnosis not present

## 2024-03-18 DIAGNOSIS — K621 Rectal polyp: Secondary | ICD-10-CM | POA: Diagnosis not present

## 2024-03-18 DIAGNOSIS — K635 Polyp of colon: Secondary | ICD-10-CM | POA: Insufficient documentation

## 2024-03-18 DIAGNOSIS — F32A Depression, unspecified: Secondary | ICD-10-CM | POA: Insufficient documentation

## 2024-03-18 DIAGNOSIS — E1140 Type 2 diabetes mellitus with diabetic neuropathy, unspecified: Secondary | ICD-10-CM | POA: Diagnosis not present

## 2024-03-18 DIAGNOSIS — I10 Essential (primary) hypertension: Secondary | ICD-10-CM | POA: Diagnosis not present

## 2024-03-18 DIAGNOSIS — F17200 Nicotine dependence, unspecified, uncomplicated: Secondary | ICD-10-CM | POA: Diagnosis not present

## 2024-03-18 DIAGNOSIS — K648 Other hemorrhoids: Secondary | ICD-10-CM | POA: Insufficient documentation

## 2024-03-18 DIAGNOSIS — F17210 Nicotine dependence, cigarettes, uncomplicated: Secondary | ICD-10-CM | POA: Diagnosis not present

## 2024-03-18 DIAGNOSIS — Z1211 Encounter for screening for malignant neoplasm of colon: Secondary | ICD-10-CM | POA: Diagnosis present

## 2024-03-18 HISTORY — DX: Depression, unspecified: F32.A

## 2024-03-18 HISTORY — PX: ESOPHAGOGASTRODUODENOSCOPY: SHX5428

## 2024-03-18 HISTORY — PX: COLONOSCOPY: SHX5424

## 2024-03-18 HISTORY — DX: Anxiety disorder, unspecified: F41.9

## 2024-03-18 LAB — GLUCOSE, CAPILLARY: Glucose-Capillary: 126 mg/dL — ABNORMAL HIGH (ref 70–99)

## 2024-03-18 LAB — HM COLONOSCOPY

## 2024-03-18 SURGERY — COLONOSCOPY
Anesthesia: Monitor Anesthesia Care

## 2024-03-18 MED ORDER — EPHEDRINE SULFATE (PRESSORS) 25 MG/5ML IV SOSY
PREFILLED_SYRINGE | INTRAVENOUS | Status: DC | PRN
  Administered 2024-03-18: 10 mg via INTRAVENOUS

## 2024-03-18 MED ORDER — GLYCOPYRROLATE PF 0.2 MG/ML IJ SOSY
PREFILLED_SYRINGE | INTRAMUSCULAR | Status: DC | PRN
  Administered 2024-03-18: .2 mg via INTRAVENOUS

## 2024-03-18 MED ORDER — PROPOFOL 500 MG/50ML IV EMUL
INTRAVENOUS | Status: DC | PRN
  Administered 2024-03-18: 30 mg via INTRAVENOUS
  Administered 2024-03-18: 150 ug/kg/min via INTRAVENOUS
  Administered 2024-03-18: 70 mg via INTRAVENOUS

## 2024-03-18 MED ORDER — LACTATED RINGERS IV SOLN: INTRAVENOUS | Status: DC | PRN

## 2024-03-18 MED ORDER — LIDOCAINE 2% (20 MG/ML) 5 ML SYRINGE
INTRAMUSCULAR | Status: DC | PRN
  Administered 2024-03-18: 100 mg via INTRAVENOUS

## 2024-03-18 MED ORDER — PHENYLEPHRINE 80 MCG/ML (10ML) SYRINGE FOR IV PUSH (FOR BLOOD PRESSURE SUPPORT)
PREFILLED_SYRINGE | INTRAVENOUS | Status: DC | PRN
Start: 2024-03-18 — End: 2024-03-18
  Administered 2024-03-18 (×2): 160 ug via INTRAVENOUS
  Administered 2024-03-18 (×2): 80 ug via INTRAVENOUS

## 2024-03-18 NOTE — Op Note (Signed)
 [facility] Patient Name: Veronica Garcia Procedure Date: 03/18/2024 9:22 AM MRN: [id number] Date of Birth: [birth year] Attending MD: Toribio Fortune , , [id number] CSN: [id number] Age: 53 Admit Type: Outpatient Procedure:                Colonoscopy Indications:              Screening for colorectal malignant neoplasm Providers:                Toribio Fortune, Madelin Hunter, RN, Chad Wilson,                            Technician Referring MD:              Medicines:                Monitored Anesthesia Care Complications:            No immediate complications. Estimated Blood Loss:     Estimated blood loss: none. Procedure:                Pre-Anesthesia Assessment:                           - Prior to the procedure, a History and Physical                            was performed, and patient medications, allergies                            and sensitivities were reviewed. The patient's                            tolerance of previous anesthesia was reviewed.                           - The risks and benefits of the procedure and the                            sedation options and risks were discussed with the                            patient. All questions were answered and informed                            consent was obtained.                           - ASA Grade Assessment: II - A patient with mild                            systemic disease.                           After obtaining informed consent, the colonoscope                            was passed under direct vision. Throughout the  procedure, the patient's blood pressure, pulse, and                            oxygen saturations were monitored continuously. The                            [id number]-[id number] ([id number]) Peds Colon was introduced                            through the anus and advanced to the the cecum,                            identified by appendiceal orifice and ileocecal                             valve. The colonoscopy was performed without                            difficulty. The patient tolerated the procedure                            well. The quality of the bowel preparation was good. Scope In: 9:50:29 AM Scope Out: 10:10:02 AM Scope Withdrawal Time: 0 hours 16 minutes 1 second  Total Procedure Duration: 0 hours 19 minutes 33 seconds  Findings:      The perianal and digital rectal examinations were normal.      Three sessile polyps were found in the rectum and descending colon. The       polyps were 3 to 5 mm in size. These polyps were removed with a cold       snare. Resection and retrieval were complete.      Non-bleeding internal hemorrhoids were found during retroflexion. The       hemorrhoids were small. Impression:               - Three 3 to 5 mm polyps in the rectum and in the                            descending colon, removed with a cold snare.                            Resected and retrieved.                           - Non-bleeding internal hemorrhoids. Moderate Sedation:      Per Anesthesia Care Recommendation:           - Discharge patient to home (ambulatory).                           - Resume previous diet.                           - Await pathology results.                           - Repeat colonoscopy for surveillance based  on                            pathology results. Procedure Code(s):        --- Professional ---                           [phone], Colonoscopy, flexible; with removal of                            tumor(s), polyp(s), or other lesion(s) by snare                            technique Diagnosis Code(s):        --- Professional ---                           Z12.11, Encounter for screening for malignant                            neoplasm of colon                           D12.8, Benign neoplasm of rectum                           D12.4, Benign neoplasm of descending colon                           K64.8, Other  hemorrhoids CPT copyright 2022 American Medical Association. All rights reserved. The codes documented in this report are preliminary and upon coder review may  be revised to meet current compliance requirements. Toribio Fortune, MD Toribio Fortune,  03/18/2024 10:24:14 AM This report has been signed electronically. Number of Addenda: 0

## 2024-03-18 NOTE — Discharge Instructions (Signed)
 You are being discharged to home.  Resume your previous diet.  We are waiting for your pathology results.  Your physician has recommended a repeat colonoscopy for surveillance based on pathology results.

## 2024-03-18 NOTE — Anesthesia Preprocedure Evaluation (Signed)
 Anesthesia Evaluation  Patient identified by MRN, date of birth, ID band Patient awake    Reviewed: Allergy & Precautions, H&P , NPO status , Patient's Chart, lab work & pertinent test results, reviewed documented beta blocker date and time   Airway Mallampati: II  TM Distance: >3 FB Neck ROM: full    Dental no notable dental hx.    Pulmonary asthma , COPD, Current Smoker   Pulmonary exam normal breath sounds clear to auscultation       Cardiovascular Exercise Tolerance: Good hypertension,  Rhythm:regular Rate:Normal     Neuro/Psych  PSYCHIATRIC DISORDERS Anxiety Depression     Neuromuscular disease    GI/Hepatic negative GI ROS, Neg liver ROS,,,  Endo/Other  diabetes    Renal/GU negative Renal ROS  negative genitourinary   Musculoskeletal   Abdominal   Peds  Hematology negative hematology ROS (+)   Anesthesia Other Findings   Reproductive/Obstetrics negative OB ROS                              Anesthesia Physical Anesthesia Plan  ASA: 2  Anesthesia Plan: MAC   Post-op Pain Management:    Induction:   PONV Risk Score and Plan: Propofol  infusion  Airway Management Planned:   Additional Equipment:   Intra-op Plan:   Post-operative Plan:   Informed Consent: I have reviewed the patients History and Physical, chart, labs and discussed the procedure including the risks, benefits and alternatives for the proposed anesthesia with the patient or authorized representative who has indicated his/her understanding and acceptance.     Dental Advisory Given  Plan Discussed with: CRNA  Anesthesia Plan Comments:         Anesthesia Quick Evaluation

## 2024-03-18 NOTE — Transfer of Care (Signed)
 Immediate Anesthesia Transfer of Care Note  Patient: Veronica Garcia  Procedure(s) Performed: COLONOSCOPY EGD (ESOPHAGOGASTRODUODENOSCOPY)  Patient Location: Endoscopy Unit  Anesthesia Type:General  Level of Consciousness: awake, alert , and oriented  Airway & Oxygen Therapy: Patient Spontanous Breathing  Post-op Assessment: Report given to RN and Post -op Vital signs reviewed and stable  Post vital signs: Reviewed and stable  Last Vitals:  Vitals Value Taken Time  BP 128/4 03/18/24 10:19  Temp 36.6 C 03/18/24 10:19  Pulse 81 03/18/24 10:19  Resp 17 03/18/24 10:19  SpO2 100 % 03/18/24 10:19    Last Pain:  Vitals:   03/18/24 1019  TempSrc: Oral  PainSc: 0-No pain      Patients Stated Pain Goal: 9 (03/18/24 0721)  Complications: No notable events documented.

## 2024-03-18 NOTE — H&P (Signed)
 Veronica Garcia is an 53 y.o. female.   Chief Complaint: Dysphagia and colorectal cancer screening HPI: Veronica Garcia is a 53 y.o. female with past medical history of COPD, diabetes, hypertension, neuropathy, asthma, who presents for evaluation of dysphagia and colorectal cancer screening.   Reports that she has presented persistent dysphagia with every swallow.  No nausea or vomiting, no heartburn. The patient denies having any nausea, vomiting, fever, chills, hematochezia, melena, hematemesis, abdominal distention, abdominal pain, diarrhea, jaundice, pruritus.   Past Medical History:  Diagnosis Date   Anxiety    Arthritis    Asthma    COPD (chronic obstructive pulmonary disease) (HCC)    Depression    Diabetes mellitus    Hypertension    Neuropathy of foot     Past Surgical History:  Procedure Laterality Date   ABDOMINAL HYSTERECTOMY     CHOLECYSTECTOMY     INCISE AND DRAIN ABCESS      History reviewed. No pertinent family history. Social History:  reports that she has been smoking cigarettes. She has never used smokeless tobacco. She reports that she does not drink alcohol and does not use drugs.  Allergies:  Allergies  Allergen Reactions   Amoxicillin Hives   Sulfa Antibiotics Hives    Medications Prior to Admission  Medication Sig Dispense Refill   amLODipine (NORVASC) 2.5 MG tablet Take 2.5 mg by mouth daily.     insulin glargine (LANTUS) 100 UNIT/ML injection Inject 10 Units into the skin daily as needed.     losartan (COZAAR) 100 MG tablet Take 100 mg by mouth daily.     meclizine (ANTIVERT) 25 MG tablet Take 25 mg by mouth daily at 6 (six) AM.     SUBOXONE 8-2 MG FILM Place 0.5 each under the tongue 4 (four) times daily.      No results found for this or any previous visit (from the past 48 hours). No results found.  Review of Systems  HENT:  Positive for trouble swallowing.   All other systems reviewed and are negative.   Blood pressure (!) 152/61, pulse  80, temperature 98.2 F (36.8 C), temperature source Oral, resp. rate 16, height 5' 2 (1.575 m), weight 83 kg, SpO2 100%. Physical Exam  GENERAL: The patient is AO x3, in no acute distress. HEENT: Head is normocephalic and atraumatic. EOMI are intact. Mouth is well hydrated and without lesions. NECK: Supple. No masses LUNGS: Clear to auscultation. No presence of rhonchi/wheezing/rales. Adequate chest expansion HEART: RRR, normal s1 and s2. ABDOMEN: Soft, nontender, no guarding, no peritoneal signs, and nondistended. BS +. No masses. EXTREMITIES: Without any cyanosis, clubbing, rash, lesions or edema. NEUROLOGIC: AOx3, no focal motor deficit. SKIN: no jaundice, no rashes  Assessment/Plan Veronica Garcia is a 53 y.o. female with past medical history of COPD, diabetes, hypertension, neuropathy, asthma, who presents for evaluation of dysphagia and colorectal cancer screening.  Will proceed with EGD and colonoscopy.  Toribio [name], MD 03/18/2024, 7:37 AM

## 2024-03-18 NOTE — Op Note (Signed)
 [facility] Patient Name: Veronica Garcia Procedure Date: 03/18/2024 9:23 AM MRN: [id number] Date of Birth: [birth year] Attending MD: Toribio Fortune , , [id number] CSN: [id number] Age: 53 Admit Type: Outpatient Procedure:                Upper GI endoscopy Indications:              Dysphagia Providers:                Toribio Fortune, Madelin Hunter, RN, Chad Wilson,                            Technician Referring MD:              Medicines:                Monitored Anesthesia Care Complications:            No immediate complications. Estimated Blood Loss:     Estimated blood loss: none. Procedure:                Pre-Anesthesia Assessment:                           - Prior to the procedure, a History and Physical                            was performed, and patient medications, allergies                            and sensitivities were reviewed. The patient's                            tolerance of previous anesthesia was reviewed.                           - The risks and benefits of the procedure and the                            sedation options and risks were discussed with the                            patient. All questions were answered and informed                            consent was obtained.                           - ASA Grade Assessment: II - A patient with mild                            systemic disease.                           After obtaining informed consent, the endoscope was                            passed under direct vision. Throughout the  procedure, the patient's blood pressure, pulse, and                            oxygen saturations were monitored continuously. The                            [id number]-[id number] ([id number]) Upper was introduced through                            the mouth, and advanced to the second part of                            duodenum. The upper GI endoscopy was accomplished                            without  difficulty. The patient tolerated the                            procedure well. Scope In: 9:35:24 AM Scope Out: 9:44:02 AM Total Procedure Duration: 0 hours 8 minutes 38 seconds  Findings:      No endoscopic abnormality was evident in the esophagus to explain the       patient's complaint of dysphagia. It was decided, however, to proceed       with dilation of the entire esophagus. A guidewire was placed and the       scope was withdrawn. Dilation was performed with a Savary dilator with       no resistance at 18 mm. The dilation site was examined and showed no       change. Biopsies were obtained from the proximal and distal esophagus       with cold forceps for histology of eosinophilic esophagitis.      The stomach was normal.      The examined duodenum was normal. Impression:               - No endoscopic esophageal abnormality to explain                            patient's dysphagia. Esophagus dilated. Dilated.                           - Normal stomach.                           - Normal examined duodenum.                           - Biopsies were taken with a cold forceps for                            evaluation of eosinophilic esophagitis. Moderate Sedation:      Per Anesthesia Care Recommendation:           - Discharge patient to home (ambulatory).                           - Resume previous diet.                           -  Await pathology results. Procedure Code(s):        --- Professional ---                           [phone], Esophagogastroduodenoscopy, flexible,                            transoral; with insertion of guide wire followed by                            passage of dilator(s) through esophagus over guide                            wire                           43239, 59, Esophagogastroduodenoscopy, flexible,                            transoral; with biopsy, single or multiple Diagnosis Code(s):        --- Professional ---                            R13.10, Dysphagia, unspecified CPT copyright 2022 [employer]. All rights reserved. The codes documented in this report are preliminary and upon coder review may  be revised to meet current compliance requirements. Toribio Fortune, MD Toribio Fortune,  03/18/2024 9:49:55 AM This report has been signed electronically. Number of Addenda: 0

## 2024-03-19 ENCOUNTER — Ambulatory Visit (INDEPENDENT_AMBULATORY_CARE_PROVIDER_SITE_OTHER): Payer: Self-pay | Admitting: Gastroenterology

## 2024-03-19 ENCOUNTER — Encounter (HOSPITAL_COMMUNITY): Payer: Self-pay | Admitting: Gastroenterology

## 2024-03-19 ENCOUNTER — Encounter (INDEPENDENT_AMBULATORY_CARE_PROVIDER_SITE_OTHER): Payer: Self-pay | Admitting: *Deleted

## 2024-03-19 LAB — SURGICAL PATHOLOGY

## 2024-03-20 NOTE — Progress Notes (Signed)
 10 yr TCS noted in recall Patient result letter mailed procedure note and pathology result faxed to PCP

## 2024-03-22 NOTE — Anesthesia Postprocedure Evaluation (Signed)
 Anesthesia Post Note  Patient: Veronica Garcia  Procedure(s) Performed: COLONOSCOPY EGD (ESOPHAGOGASTRODUODENOSCOPY)  Patient location during evaluation: Phase II Anesthesia Type: MAC Level of consciousness: awake Pain management: pain level controlled Vital Signs Assessment: post-procedure vital signs reviewed and stable Respiratory status: spontaneous breathing and respiratory function stable Cardiovascular status: blood pressure returned to baseline and stable Postop Assessment: no headache and no apparent nausea or vomiting Anesthetic complications: no Comments: Late entry   No notable events documented.   Last Vitals:  Vitals:   03/18/24 1019 03/18/24 1025  BP: (!) 128/48 (!) 121/59  Pulse: 81 83  Resp: 17 18  Temp: 36.6 C   SpO2: 100% 100%    Last Pain:  Vitals:   03/18/24 1025  TempSrc:   PainSc: 0-No pain                 Yvonna JINNY Bosworth

## 2024-04-08 ENCOUNTER — Telehealth (INDEPENDENT_AMBULATORY_CARE_PROVIDER_SITE_OTHER): Payer: Self-pay | Admitting: Gastroenterology

## 2024-04-08 DIAGNOSIS — R1319 Other dysphagia: Secondary | ICD-10-CM

## 2024-04-08 NOTE — Telephone Encounter (Signed)
 Pt contacted and verbalized understanding. Advised pt that scheduler will be in touch

## 2024-04-08 NOTE — Telephone Encounter (Signed)
 Called pt and she has been give bpe appt details.

## 2024-04-08 NOTE — Telephone Encounter (Signed)
 Thanks.  Her upper endoscopy did not show any abnormalities and had empiric dilation.  Next step will be to perform a barium esophagram.  We will order this.  Depending on symptoms, we will need to refer her for an esophageal manometry at [facility].  Hi Autumn/Mindy/Tammy, can you please schedule a barium esophagram? Dx: Dysphagia.   Thanks,  Toribio Fortune, MD Gastroenterology and Hepatology [facility] Gastroenterology

## 2024-04-08 NOTE — Telephone Encounter (Signed)
 Pt LMOM over the weekend that she had procedure done on 11/24 and she is still having problems with sore scratchy throat and feels like something is swollen. She is aware of her OV to follow up. [phone]

## 2024-04-08 NOTE — Addendum Note (Signed)
 Addended by: JEANELL GRAEME RAMAN on: 04/08/2024 02:14 PM   Modules accepted: Orders

## 2024-04-08 NOTE — Telephone Encounter (Signed)
 Left message to return call

## 2024-04-08 NOTE — Telephone Encounter (Signed)
 Pt left message returning call. TCS/EGD 03/18/24 pt states she is having issues with her throat;  unable to swallow right, throat swollen, can feel the food, has to clear throat but unable to get anything up. Pt is eating soft foods. Feels swollen but not sore. Can swallow liquids but pt states I can feel it going by something. Pt states its hard to explain please advise thank you.

## 2024-04-10 ENCOUNTER — Ambulatory Visit (HOSPITAL_COMMUNITY)

## 2024-04-10 NOTE — Telephone Encounter (Signed)
 Veronica Garcia transferred VM from patient asking if she can have juice early hours in the morning in case her sugar drops.  Called pt, advised she can have liquids up until 3 hours prior to her arrival for BPE. She voiced understanding.

## 2024-04-15 ENCOUNTER — Other Ambulatory Visit (HOSPITAL_COMMUNITY)

## 2024-04-22 ENCOUNTER — Telehealth (INDEPENDENT_AMBULATORY_CARE_PROVIDER_SITE_OTHER): Payer: Self-pay

## 2024-04-22 NOTE — Telephone Encounter (Signed)
 I spoke with the patient and made her aware per Dr. Cinderella, Patient can have sugar containing juices the night before and the day of the procedure. Patient states understanding, but says she does not know the number to reschedule. I transferred her to [facility] to get rescheduled.

## 2024-04-22 NOTE — Telephone Encounter (Signed)
 Patient states she was unable to perform the DG Esophagus w Double Cm and had to cancel this due to needing to be NPO, but patient blood sugars were dropping through the night before and she says she finds it hard to stay NPO due to the issues with blood sugars dropping. Patient will need this rescheduled, but needs to know what to do if her blood sugars drop during the night prior to the testing.   Please advise.

## 2024-04-22 NOTE — Telephone Encounter (Signed)
 Patient can have sugar containing juices the night before and the day of the procedure

## 2024-04-26 ENCOUNTER — Ambulatory Visit (HOSPITAL_COMMUNITY)

## 2024-05-01 ENCOUNTER — Ambulatory Visit (INDEPENDENT_AMBULATORY_CARE_PROVIDER_SITE_OTHER): Payer: Self-pay | Admitting: Gastroenterology

## 2024-05-01 ENCOUNTER — Ambulatory Visit (HOSPITAL_COMMUNITY)
Admission: RE | Admit: 2024-05-01 | Discharge: 2024-05-01 | Disposition: A | Discharge status: Home / Self Care | Source: Ambulatory Visit | Attending: Gastroenterology | Admitting: Gastroenterology

## 2024-05-01 DIAGNOSIS — R1319 Other dysphagia: Secondary | ICD-10-CM | POA: Insufficient documentation

## 2024-05-01 MED ORDER — PANTOPRAZOLE SODIUM 40 MG PO TBEC: 40.0000 mg | DELAYED_RELEASE_TABLET | Freq: Every day | ORAL | 3 refills | Status: DC

## 2024-05-01 NOTE — Progress Notes (Signed)
 Hi Ann ,  Can you please arrange a referral for this patient to [facility] for esophageal manometry   Diagnosis: Dysphagia   ===============  Hi Tanya ,  Can you please call the patient and tell the esophagram suggest reflux and also moderate esophageal dysmotility meaning it may be that your food pipe is not moving well  I recommend taking Protonix  40mg  , 30 min before breakfast   Also because there is concern for dysmotility(movement of esophagus is not adequate), next I would be to obtain manometry at [facility] and if patient is agreeable we can refer her  Thanks,  Cabria Micalizzi Faizan Donavan Kerlin, MD Gastroenterology and Hepatology [facility] Gastroenterology =========== IMPRESSION: 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   Spontaneous gastroesophageal reflux to the lower third of the esophagus.   No masses or lesions noted.

## 2024-05-06 ENCOUNTER — Telehealth (INDEPENDENT_AMBULATORY_CARE_PROVIDER_SITE_OTHER): Payer: Self-pay | Admitting: Gastroenterology

## 2024-05-06 NOTE — Telephone Encounter (Signed)
 Pt left voicemail stating she tried to take the Protonix  that was given but she got choked. Pt is requesting something she can swallow such as a capsule that she can open up.  (Pt was prescribed Protonix   after esophagram on 05/01/24 by Dr.Ahmed. Dr.Ahmed is on PAL this week.) Please advise. Thank you!

## 2024-05-07 ENCOUNTER — Other Ambulatory Visit (INDEPENDENT_AMBULATORY_CARE_PROVIDER_SITE_OTHER): Payer: Self-pay | Admitting: Gastroenterology

## 2024-05-07 MED ORDER — OMEPRAZOLE 40 MG PO CPDR: 40.0000 mg | DELAYED_RELEASE_CAPSULE | Freq: Every day | ORAL | 1 refills | Status: AC

## 2024-05-07 NOTE — Telephone Encounter (Signed)
 Pt left voicemail yesterday afternoon in regards to Protonix  being switched. Please advise. Thank you!

## 2024-05-07 NOTE — Telephone Encounter (Signed)
 Left message to return call

## 2024-05-08 NOTE — Telephone Encounter (Signed)
 Left message to return call

## 2024-05-08 NOTE — Telephone Encounter (Signed)
 Pt left voicemail returning call. Returned call to patient and advised that we have sent in Omeprazole  capsules to her pharmacy. Pt states she is unable to have the test that he wants done until June(esophageal manometry). Pt reminded of appointment on 05/13/24; pt states she may have to cancel that. I advised pt to give us  a call if she did need to cancel. Pt verbalized understanding of all.

## 2024-05-13 ENCOUNTER — Ambulatory Visit (INDEPENDENT_AMBULATORY_CARE_PROVIDER_SITE_OTHER): Admitting: Gastroenterology

## 2024-05-21 ENCOUNTER — Ambulatory Visit (INDEPENDENT_AMBULATORY_CARE_PROVIDER_SITE_OTHER): Admitting: Gastroenterology
